# Patient Record
Sex: Female | Born: 2000 | Race: Black or African American | Hispanic: No | Marital: Single | Smoking: Never smoker
Health system: Southern US, Community
[De-identification: ages and names within clinical notes are randomized; demographics above are authoritative.]

## PROBLEM LIST (undated history)

## (undated) DIAGNOSIS — L309 Dermatitis, unspecified: Secondary | ICD-10-CM

## (undated) DIAGNOSIS — R7303 Prediabetes: Secondary | ICD-10-CM

## (undated) HISTORY — DX: Prediabetes: R73.03

---
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
## 2018-02-08 IMAGING — CR DG TOE GREAT 2+V*L*
3 series · 3 of 3 positions shown · non-contrast
Comparison: None.

CLINICAL DATA: Left great toe pain after injury going down steps
today.

EXAM:
LEFT GREAT TOE

[t toes ap left]
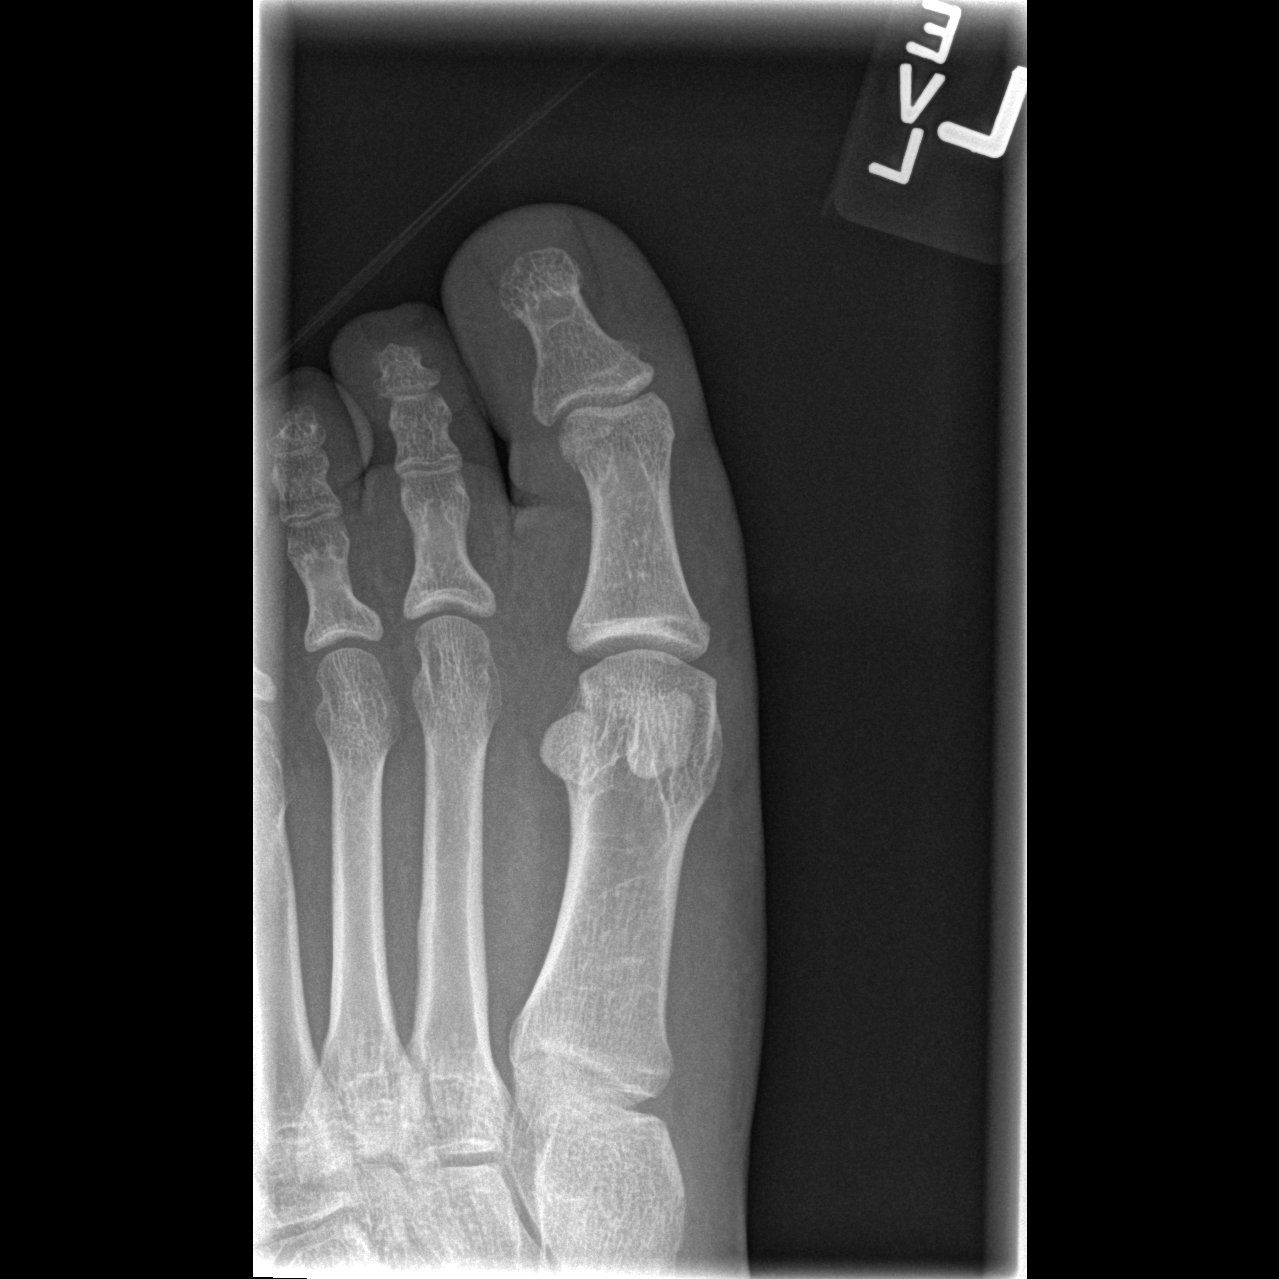

[t toes oblique left]
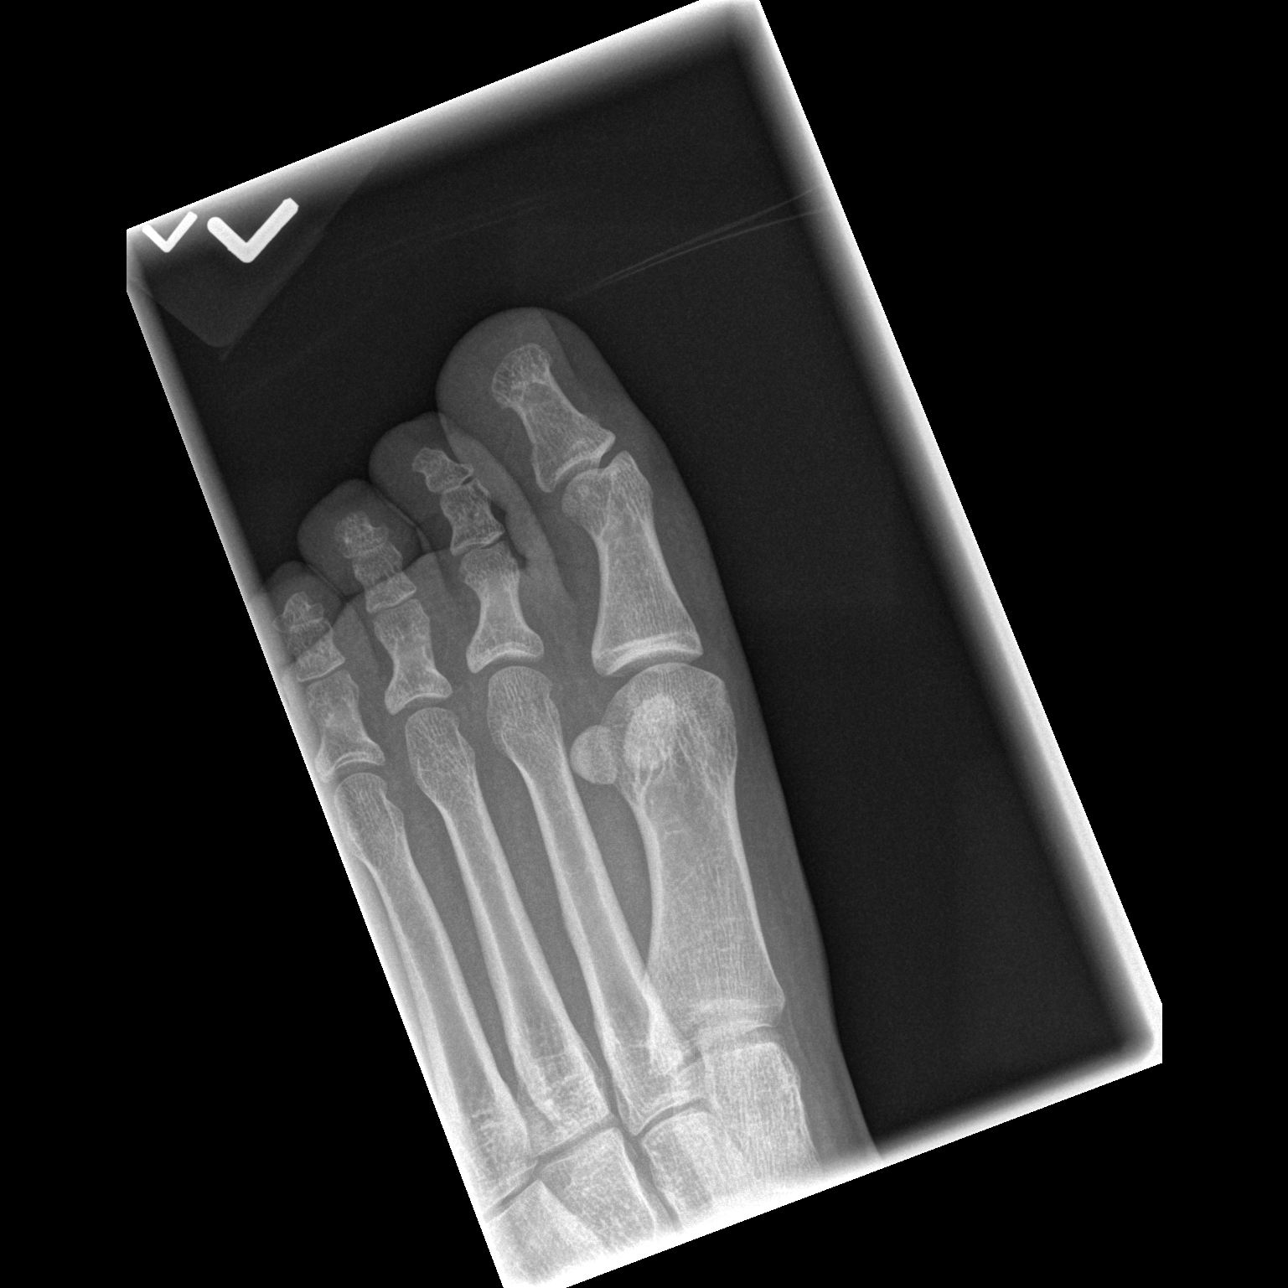

[t toes lateral left]
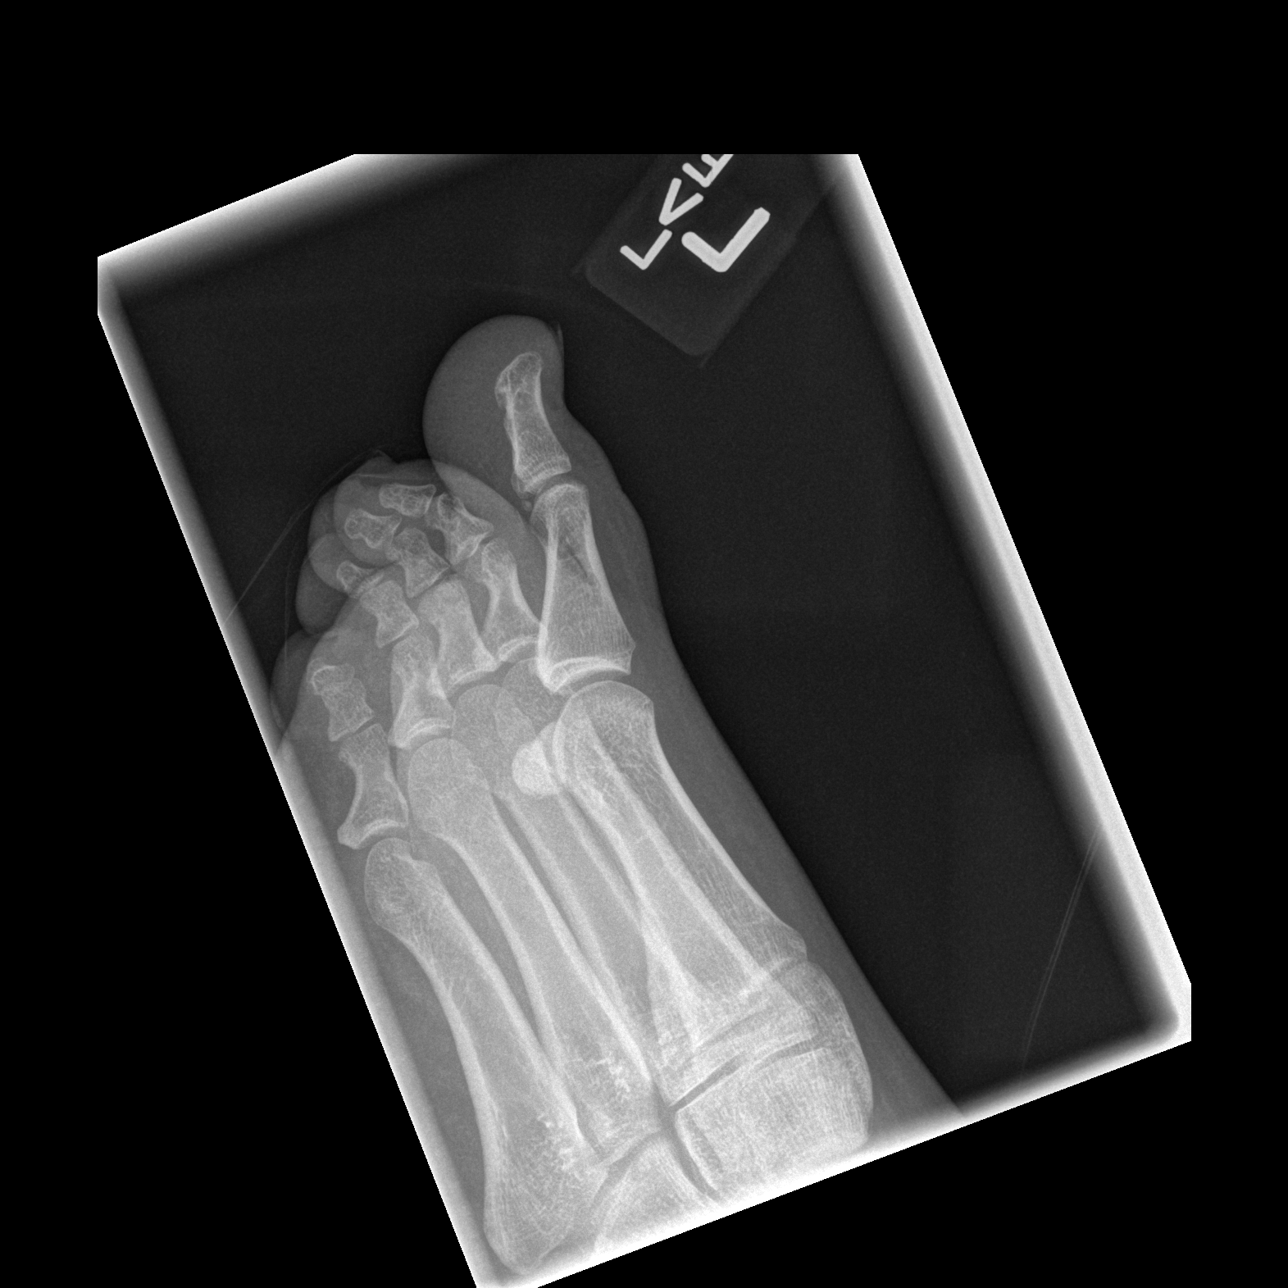

[3 of 3 positions shown; findings below may reference images not displayed]

FINDINGS: Findings suggesting nondisplaced fracture of the mid to distal
aspect of the first proximal phalanx. Remainder the exam is
unremarkable.
IMPRESSION: Suggestion a nondisplaced fracture of the mid to distal aspect of
the first proximal phalanx.

## 2019-07-28 ENCOUNTER — Other Ambulatory Visit: Payer: Self-pay

## 2019-07-28 DIAGNOSIS — Z20822 Contact with and (suspected) exposure to covid-19: Secondary | ICD-10-CM

## 2019-07-30 LAB — NOVEL CORONAVIRUS, NAA: SARS-CoV-2, NAA: NOT DETECTED

## 2020-11-20 ENCOUNTER — Ambulatory Visit: Payer: Self-pay | Attending: Internal Medicine

## 2020-11-20 DIAGNOSIS — Z23 Encounter for immunization: Secondary | ICD-10-CM

## 2020-11-20 NOTE — Progress Notes (Signed)
   Covid-19 Vaccination Clinic  Name:  Virginia Parker    MRN: [id number] DOB: 2001-02-24  11/20/2020  [name] was observed post Covid-19 immunization for 15 minutes without incident. She was provided with Vaccine Information Sheet and instruction to access the V-Safe system.   [name] was instructed to call 911 with any severe reactions post vaccine: Marland Kitchen Difficulty breathing  . Swelling of face and throat  . A fast heartbeat  . A bad rash all over body  . Dizziness and weakness   Immunizations Administered    Name Date Dose VIS Date Route   Pfizer COVID-19 Vaccine 11/20/2020  3:35 PM 0.3 mL 09/19/2020 Intramuscular   Manufacturer: [employer], [employer]   Lot: [id number]   NDC: [id number]

## 2024-03-04 ENCOUNTER — Other Ambulatory Visit: Payer: Self-pay

## 2024-03-04 ENCOUNTER — Encounter (HOSPITAL_BASED_OUTPATIENT_CLINIC_OR_DEPARTMENT_OTHER): Payer: Self-pay | Admitting: Urology

## 2024-03-04 ENCOUNTER — Emergency Department (HOSPITAL_BASED_OUTPATIENT_CLINIC_OR_DEPARTMENT_OTHER)
Admission: EM | Admit: 2024-03-04 | Discharge: 2024-03-05 | Disposition: A | Discharge status: Home / Self Care | Payer: Self-pay | Attending: Emergency Medicine | Admitting: Emergency Medicine

## 2024-03-04 DIAGNOSIS — R1013 Epigastric pain: Secondary | ICD-10-CM | POA: Diagnosis not present

## 2024-03-04 DIAGNOSIS — R197 Diarrhea, unspecified: Secondary | ICD-10-CM | POA: Insufficient documentation

## 2024-03-04 DIAGNOSIS — R748 Abnormal levels of other serum enzymes: Secondary | ICD-10-CM

## 2024-03-04 DIAGNOSIS — E860 Dehydration: Secondary | ICD-10-CM

## 2024-03-04 DIAGNOSIS — D509 Iron deficiency anemia, unspecified: Secondary | ICD-10-CM | POA: Diagnosis not present

## 2024-03-04 LAB — CBC
HCT: 29 % — ABNORMAL LOW (ref 36.0–46.0)
Hemoglobin: 8.7 g/dL — ABNORMAL LOW (ref 12.0–15.0)
MCH: 23 pg — ABNORMAL LOW (ref 26.0–34.0)
MCHC: 30 g/dL (ref 30.0–36.0)
MCV: 76.7 fL — ABNORMAL LOW (ref 80.0–100.0)
Platelets: 131 10*3/uL — ABNORMAL LOW (ref 150–400)
RBC: 3.78 MIL/uL — ABNORMAL LOW (ref 3.87–5.11)
RDW: 16.8 % — ABNORMAL HIGH (ref 11.5–15.5)
WBC: 11.8 10*3/uL — ABNORMAL HIGH (ref 4.0–10.5)
nRBC: 0 % (ref 0.0–0.2)

## 2024-03-04 LAB — LIPASE, BLOOD: Lipase: 25 U/L (ref 11–51)

## 2024-03-04 LAB — COMPREHENSIVE METABOLIC PANEL WITH GFR
ALT: 65 U/L — ABNORMAL HIGH (ref 0–44)
AST: 116 U/L — ABNORMAL HIGH (ref 15–41)
Albumin: 3.5 g/dL (ref 3.5–5.0)
Alkaline Phosphatase: 67 U/L (ref 38–126)
Anion gap: 9 (ref 5–15)
BUN: 12 mg/dL (ref 6–20)
CO2: 20 mmol/L — ABNORMAL LOW (ref 22–32)
Calcium: 8.3 mg/dL — ABNORMAL LOW (ref 8.9–10.3)
Chloride: 105 mmol/L (ref 98–111)
Creatinine, Ser: 1.09 mg/dL — ABNORMAL HIGH (ref 0.44–1.00)
GFR, Estimated: >60 mL/min (ref >60)
Glucose, Bld: 106 mg/dL — ABNORMAL HIGH (ref 70–99)
Potassium: 3.4 mmol/L — ABNORMAL LOW (ref 3.5–5.1)
Sodium: 134 mmol/L — ABNORMAL LOW (ref 135–145)
Total Bilirubin: 0.7 mg/dL (ref 0.0–1.2)
Total Protein: 7.6 g/dL (ref 6.5–8.1)

## 2024-03-04 MED ORDER — SODIUM CHLORIDE 0.9 % IV BOLUS
1000.0000 mL | Freq: Once | INTRAVENOUS | Status: AC
  Administered 2024-03-04: 1000 mL via INTRAVENOUS

## 2024-03-04 NOTE — ED Triage Notes (Signed)
 Pt states diarrhea x 2 days with associated abdominal pain  States feels dehydrated  Was started on protonix recently for gerd  Was started on Iron pills and B12 with new dx of anemia

## 2024-03-04 NOTE — ED Provider Notes (Signed)
 [place] [facility] Provider Note   CSN: [id number] Arrival date & time: 03/04/24  2224     History  Chief Complaint  Patient presents with   Diarrhea    Virginia Parker is a 23 y.o. female.  Patient to ED for evaluation of diarrhea for the past 2 days associated with epigastric abdominal pain. Today with single episode vomiting. No fever. She reports generalized weakness and feeling lightheaded when she gets up to walk. No syncope. Stool has been brown in color, nonbloody. No recent travel or antibiotic use. History of anemia with heavy periods, recently started on iron supplements and B12.   The history is provided by the patient and a relative. No language interpreter was used.  Diarrhea      Home Medications Prior to Admission medications   Medication Sig Start Date End Date Taking? Authorizing Provider  albuterol (ACCUNEB) 0.63 MG/3ML nebulizer solution Inhale into the lungs. 10/01/21  Yes [provider]  Cetirizine HCl (ZYRTEC ALLERGY) 10 MG CAPS Take by mouth. 02/25/19  Yes [provider]  ferrous sulfate 325 (65 FE) MG tablet Take by mouth. 02/17/24 08/15/24 Yes [provider]  fluticasone (FLONASE) 50 MCG/ACT nasal spray Place into the nose. 05/22/22 08/11/24 Yes [provider]  pantoprazole (PROTONIX) 40 MG tablet Take by mouth. 02/15/24 03/16/24 Yes [provider]  ibuprofen (ADVIL,MOTRIN) 400 MG tablet Take 400 mg by mouth every 6 (six) hours as needed.    [provider]      Allergies    Patient has no known allergies.    Review of Systems   Review of Systems  Gastrointestinal:  Positive for diarrhea.    Physical Exam Updated Vital Signs BP 109/63   Pulse (!) 105   Temp 97.8 F (36.6 C)   Resp 18   Ht 5\' 2"  (1.575 m)   Wt 86.2 kg   LMP 02/12/2024   SpO2 99%   BMI 34.76 kg/m  Physical Exam Vitals and nursing note reviewed.  Constitutional:      Appearance: She  is well-developed.  HENT:     Head: Normocephalic.  Eyes:     Comments: Conjunctival pallor.  Cardiovascular:     Rate and Rhythm: Regular rhythm. Tachycardia present.     Heart sounds: No murmur heard. Pulmonary:     Effort: Pulmonary effort is normal.     Breath sounds: Normal breath sounds. No wheezing, rhonchi or rales.  Abdominal:     General: Bowel sounds are normal.     Palpations: Abdomen is soft.     Tenderness: There is abdominal tenderness (epigastric and LUQ tenderness.). There is no guarding or rebound.  Musculoskeletal:        General: Normal range of motion.     Cervical back: Normal range of motion and neck supple.  Skin:    General: Skin is warm and dry.  Neurological:     General: No focal deficit present.     Mental Status: She is alert and oriented to person, place, and time.     ED Results / Procedures / Treatments   Labs (all labs ordered are listed, but only abnormal results are displayed) Labs Reviewed  COMPREHENSIVE METABOLIC PANEL WITH GFR - Abnormal; Notable for the following components:      Result Value   Sodium 134 (*)    Potassium 3.4 (*)    CO2 20 (*)    Glucose, Bld 106 (*)  Creatinine, Ser 1.09 (*)    Calcium 8.3 (*)    AST 116 (*)    ALT 65 (*)    All other components within normal limits  CBC - Abnormal; Notable for the following components:   WBC 11.8 (*)    RBC 3.78 (*)    Hemoglobin 8.7 (*)    HCT 29.0 (*)    MCV 76.7 (*)    MCH 23.0 (*)    RDW 16.8 (*)    Platelets 131 (*)    All other components within normal limits  LIPASE, BLOOD  URINALYSIS, ROUTINE W REFLEX MICROSCOPIC  PREGNANCY, URINE   Results for orders placed or performed during the hospital encounter of 03/04/24  Lipase, blood   Collection Time: 03/04/24 10:40 PM  Result Value Ref Range   Lipase 25 11 - 51 U/L  Comprehensive metabolic panel   Collection Time: 03/04/24 10:40 PM  Result Value Ref Range   Sodium 134 (L) 135 - 145 mmol/L   Potassium 3.4  (L) 3.5 - 5.1 mmol/L   Chloride 105 98 - 111 mmol/L   CO2 20 (L) 22 - 32 mmol/L   Glucose, Bld 106 (H) 70 - 99 mg/dL   BUN 12 6 - 20 mg/dL   Creatinine, Ser 5.62 (H) 0.44 - 1.00 mg/dL   Calcium 8.3 (L) 8.9 - 10.3 mg/dL   Total Protein 7.6 6.5 - 8.1 g/dL   Albumin 3.5 3.5 - 5.0 g/dL   AST 130 (H) 15 - 41 U/L   ALT 65 (H) 0 - 44 U/L   Alkaline Phosphatase 67 38 - 126 U/L   Total Bilirubin 0.7 0.0 - 1.2 mg/dL   GFR, Estimated >86 >57 mL/min   Anion gap 9 5 - 15  CBC   Collection Time: 03/04/24 10:40 PM  Result Value Ref Range   WBC 11.8 (H) 4.0 - 10.5 K/uL   RBC 3.78 (L) 3.87 - 5.11 MIL/uL   Hemoglobin 8.7 (L) 12.0 - 15.0 g/dL   HCT 84.6 (L) 96.2 - 95.2 %   MCV 76.7 (L) 80.0 - 100.0 fL   MCH 23.0 (L) 26.0 - 34.0 pg   MCHC 30.0 30.0 - 36.0 g/dL   RDW 84.1 (H) 32.4 - 40.1 %   Platelets 131 (L) 150 - 400 K/uL   nRBC 0.0 0.0 - 0.2 %    EKG None  Radiology No results found.  Procedures Procedures    Medications Ordered in ED Medications  sodium chloride 0.9 % bolus 1,000 mL (0 mLs Intravenous Stopped 03/04/24 2328)  sodium chloride 0.9 % bolus 1,000 mL (1,000 mLs Intravenous New Bag/Given 03/04/24 2329)    ED Course/ Medical Decision Making/ A&P                                 Medical Decision Making This patient presents to the ED for concern of diarrhea, this involves an extensive number of treatment options, and is a complaint that carries with it a high risk of complications and morbidity.  The differential diagnosis includes infectious diarrhea, diverticulitis, obstruction   Co morbidities that complicate the patient evaluation  H/o anemia   Additional history obtained:  Additional history and/or information obtained from chart review, notable for n/a   Lab Tests:  I Ordered, and personally interpreted labs.  The pertinent results include:  WBC 11.8, hgb 8.7 (8.9 on 3/18 [facility]), Na 134, K+ 3.4, CO2 20,  Cr. 1.09, BUN 12, AST 116, ALT 65    Imaging Studies  ordered:  I ordered imaging studies including n/a I independently visualized and interpreted imaging which showed n/a I agree with the radiologist interpretation   Cardiac Monitoring:  The patient was maintained on a cardiac monitor.  I personally viewed and interpreted the cardiac monitored which showed an underlying rhythm of: n/a  Test Considered:  N/a   Critical Interventions:  N/a   Consultations Obtained:  I requested consultation with the n/a,  and discussed lab and imaging findings as well as pertinent plan - they recommend: n/a   Problem List / ED Course:  Diarrhea for 2 days with epigastric pain No fever History of anemia, stable tonight at 8.7 Appears dehydrated    Social Determinants of Health:  N/a   Disposition:  After consideration of the diagnostic results and the patients response to treatment, I feel that the patient would benefit from:. Patient care signed out to Dr. Madilyn Hook pending re-evaluation after hydration.    Amount and/or Complexity of Data Reviewed Labs: ordered.           Final Clinical Impression(s) / ED Diagnoses Final diagnoses:  Diarrhea, unspecified type  Iron deficiency anemia, unspecified iron deficiency anemia type    Rx / DC Orders ED Discharge Orders     None         Danne Harbor 03/04/24 2353    Tilden Fossa, MD 03/05/24 [phone]

## 2024-03-04 NOTE — ED Notes (Signed)
 ED Provider at bedside.

## 2024-03-05 MED ORDER — SODIUM CHLORIDE 0.9 % IV BOLUS
1000.0000 mL | Freq: Once | INTRAVENOUS | Status: AC
  Administered 2024-03-05: 1000 mL via INTRAVENOUS

## 2024-03-05 MED ORDER — ACETAMINOPHEN 500 MG PO TABS
1000.0000 mg | ORAL_TABLET | Freq: Once | ORAL | Status: AC
Start: 2024-03-05 — End: 2024-03-05
  Administered 2024-03-05: 1000 mg via ORAL
  Filled 2024-03-05: qty 2

## 2024-03-05 NOTE — Discharge Instructions (Signed)
 Please see your doctor in the next week to have your labs rechecked.
